# Patient Record
Sex: Female | Born: 1935 | Race: White | Hispanic: No | Marital: Married | Smoking: Current every day smoker
Health system: Southern US, Community
[De-identification: ages and names within clinical notes are randomized; demographics above are authoritative.]

## PROBLEM LIST (undated history)

## (undated) DIAGNOSIS — I10 Essential (primary) hypertension: Secondary | ICD-10-CM

## (undated) DIAGNOSIS — J8410 Pulmonary fibrosis, unspecified: Secondary | ICD-10-CM

## (undated) DIAGNOSIS — D61818 Other pancytopenia: Secondary | ICD-10-CM

## (undated) DIAGNOSIS — I4891 Unspecified atrial fibrillation: Secondary | ICD-10-CM

## (undated) DIAGNOSIS — I2721 Secondary pulmonary arterial hypertension: Secondary | ICD-10-CM

## (undated) DIAGNOSIS — F419 Anxiety disorder, unspecified: Secondary | ICD-10-CM

## (undated) DIAGNOSIS — I509 Heart failure, unspecified: Secondary | ICD-10-CM

---

## 2014-11-26 ENCOUNTER — Encounter: Payer: Self-pay | Admitting: Emergency Medicine

## 2014-11-26 ENCOUNTER — Emergency Department
Admission: EM | Admit: 2014-11-26 | Discharge: 2014-11-26 | Disposition: A | Discharge status: Other Acute Inpatient Hospital | Payer: Medicare Other | Attending: Emergency Medicine | Admitting: Emergency Medicine

## 2014-11-26 ENCOUNTER — Emergency Department: Payer: Medicare Other

## 2014-11-26 ENCOUNTER — Other Ambulatory Visit: Payer: Self-pay

## 2014-11-26 DIAGNOSIS — Y998 Other external cause status: Secondary | ICD-10-CM | POA: Insufficient documentation

## 2014-11-26 DIAGNOSIS — Z7901 Long term (current) use of anticoagulants: Secondary | ICD-10-CM | POA: Diagnosis not present

## 2014-11-26 DIAGNOSIS — Y9289 Other specified places as the place of occurrence of the external cause: Secondary | ICD-10-CM | POA: Insufficient documentation

## 2014-11-26 DIAGNOSIS — S22059A Unspecified fracture of T5-T6 vertebra, initial encounter for closed fracture: Secondary | ICD-10-CM | POA: Diagnosis not present

## 2014-11-26 DIAGNOSIS — S22049A Unspecified fracture of fourth thoracic vertebra, initial encounter for closed fracture: Secondary | ICD-10-CM | POA: Diagnosis not present

## 2014-11-26 DIAGNOSIS — S12100A Unspecified displaced fracture of second cervical vertebra, initial encounter for closed fracture: Secondary | ICD-10-CM | POA: Diagnosis not present

## 2014-11-26 DIAGNOSIS — Z79899 Other long term (current) drug therapy: Secondary | ICD-10-CM | POA: Diagnosis not present

## 2014-11-26 DIAGNOSIS — S12001A Unspecified nondisplaced fracture of first cervical vertebra, initial encounter for closed fracture: Secondary | ICD-10-CM | POA: Diagnosis not present

## 2014-11-26 DIAGNOSIS — S12000A Unspecified displaced fracture of first cervical vertebra, initial encounter for closed fracture: Secondary | ICD-10-CM

## 2014-11-26 DIAGNOSIS — S199XXA Unspecified injury of neck, initial encounter: Secondary | ICD-10-CM | POA: Diagnosis present

## 2014-11-26 DIAGNOSIS — Z720 Tobacco use: Secondary | ICD-10-CM | POA: Diagnosis not present

## 2014-11-26 DIAGNOSIS — I10 Essential (primary) hypertension: Secondary | ICD-10-CM | POA: Diagnosis not present

## 2014-11-26 DIAGNOSIS — S22039A Unspecified fracture of third thoracic vertebra, initial encounter for closed fracture: Secondary | ICD-10-CM | POA: Insufficient documentation

## 2014-11-26 DIAGNOSIS — Y9389 Activity, other specified: Secondary | ICD-10-CM | POA: Insufficient documentation

## 2014-11-26 DIAGNOSIS — W1839XA Other fall on same level, initial encounter: Secondary | ICD-10-CM | POA: Insufficient documentation

## 2014-11-26 HISTORY — DX: Pulmonary fibrosis, unspecified: J84.10

## 2014-11-26 HISTORY — DX: Unspecified atrial fibrillation: I48.91

## 2014-11-26 HISTORY — DX: Other pancytopenia: D61.818

## 2014-11-26 HISTORY — DX: Anxiety disorder, unspecified: F41.9

## 2014-11-26 HISTORY — DX: Essential (primary) hypertension: I10

## 2014-11-26 HISTORY — DX: Secondary pulmonary arterial hypertension: I27.21

## 2014-11-26 HISTORY — DX: Heart failure, unspecified: I50.9

## 2014-11-26 LAB — COMPREHENSIVE METABOLIC PANEL
ALBUMIN: 3.7 g/dL (ref 3.5–5.0)
ALT: 15 U/L (ref 14–54)
AST: 28 U/L (ref 15–41)
Alkaline Phosphatase: 105 U/L (ref 38–126)
Anion gap: 9 (ref 5–15)
BUN: 17 mg/dL (ref 6–20)
CALCIUM: 8.6 mg/dL — AB (ref 8.9–10.3)
CHLORIDE: 87 mmol/L — AB (ref 101–111)
CO2: 29 mmol/L (ref 22–32)
CREATININE: 0.92 mg/dL (ref 0.44–1.00)
GFR calc non Af Amer: 52 mL/min — ABNORMAL LOW (ref >60)
GLUCOSE: 113 mg/dL — AB (ref 65–99)
POTASSIUM: 3.9 mmol/L (ref 3.5–5.1)
SODIUM: 125 mmol/L — AB (ref 135–145)
Total Bilirubin: 1 mg/dL (ref 0.3–1.2)
Total Protein: 7.3 g/dL (ref 6.5–8.1)

## 2014-11-26 LAB — CBC
HEMATOCRIT: 33.6 % — AB (ref 35.0–47.0)
HEMOGLOBIN: 11.4 g/dL — AB (ref 12.0–16.0)
MCH: 31.9 pg (ref 26.0–34.0)
MCHC: 33.9 g/dL (ref 32.0–36.0)
MCV: 94.2 fL (ref 80.0–100.0)
PLATELETS: 244 10*3/uL (ref 150–440)
RBC: 3.57 MIL/uL — AB (ref 3.80–5.20)
RDW: 13.7 % (ref 11.5–14.5)
WBC: 8.6 10*3/uL (ref 3.6–11.0)

## 2014-11-26 LAB — PROTIME-INR
INR: 1.74
Prothrombin Time: 20.5 seconds — ABNORMAL HIGH (ref 11.4–15.0)

## 2014-11-26 LAB — APTT: aPTT: 42 seconds — ABNORMAL HIGH (ref 24–36)

## 2014-11-26 LAB — TROPONIN I

## 2014-11-26 NOTE — ED Notes (Signed)
Per family this occurred 2 days ago. Pt is in soft cervical collar on    Arrival.

## 2014-11-26 NOTE — ED Notes (Signed)
Pt here s/p fall from standing after loosing balance. C/o neck pain, bilateral knee pain with bruising exp. right knee. Pt has history of falls

## 2014-11-26 NOTE — ED Provider Notes (Signed)
[facility] Emergency Department Provider Note  ____________________________________________  Time seen: 2:30 PM  I have reviewed the triage vital signs and the nursing notes.   HISTORY  Chief Complaint Fall    HPI Morgan Barnes is a 81 y.o. female who presents after a fall that occurred 1-1/2 days ago. Per her daughter patient fell while transferring from her bedside commode to her bed. She has complained of neck pain since then. She has bruising on her head and her right knee and left knee. Patient reports she is on eliquis. She denies fevers chills. No chest pain. No pelvis or hip pain. No abdominal pain. No dizziness or lightheadedness     Past Medical History  Diagnosis Date  . Hypertension   . CHF (congestive heart failure)   . Pulmonary artery hypertension   . A-fib   . Anxiety   . Pancytopenia   . Pulmonary interstitial fibrosis     There are no active problems to display for this patient.   History reviewed. No pertinent past surgical history.  Current Outpatient Rx  Name  Route  Sig  Dispense  Refill  . amiodarone (PACERONE) 200 MG tablet   Oral   Take 200 mg by mouth daily.         Marland Kitchen apixaban (ELIQUIS) 2.5 MG TABS tablet   Oral   Take 2.5 mg by mouth 2 (two) times daily.         . furosemide (LASIX) 20 MG tablet   Oral   Take 20 mg by mouth.         Marland Kitchen lisinopril (PRINIVIL,ZESTRIL) 10 MG tablet   Oral   Take 10 mg by mouth daily.         Marland Kitchen oxycodone (OXY-IR) 5 MG capsule   Oral   Take 5 mg by mouth every 4 (four) hours as needed.           Allergies Review of patient's allergies indicates no known allergies.  History reviewed. No pertinent family history.  Social History History  Substance Use Topics  . Smoking status: Current Every Day Smoker  . Smokeless tobacco: Not on file  . Alcohol Use: No    Review of Systems  Constitutional: Negative for fever. Eyes: Negative for visual changes. ENT:  Negative for sore throat Cardiovascular: Negative for chest pain. Respiratory: Negative for shortness of breath. Gastrointestinal: Negative for abdominal pain, vomiting and diarrhea. Genitourinary: Negative for dysuria. Musculoskeletal: Positive for neck pain Skin: Positive for bruising Neurological: Negative for headaches or focal weakness Psychiatric: No anxiety    ____________________________________________   PHYSICAL EXAM:  VITAL SIGNS: ED Triage Vitals  Enc Vitals Group     BP 11/26/14 1316 158/92 mmHg     Pulse Rate 11/26/14 1316 107     Resp --      Temp 11/26/14 1316 98.8 F (37.1 C)     Temp Source 11/26/14 1316 Oral     SpO2 11/26/14 1316 90 %     Weight 11/26/14 1316 93 lb (42.185 kg)     Height 11/26/14 1316 4\' 10"  (1.473 m)     Head Cir --      Peak Flow --      Pain Score 11/26/14 1318 7     Pain Loc --      Pain Edu? --      Excl. in GC? --      Constitutional: Alert and oriented. Well appearing and in no distress. Pleasant  and interactive Eyes: Conjunctivae are normal. PERRLA ENT   Head: Normocephalic and atraumatic.   Mouth/Throat: Mucous membranes are moist. Cardiovascular:  Normal and symmetric distal pulses are present in all extremities.  Respiratory: Normal respiratory effort without tachypnea nor retractions. Breath sounds are clear and equal bilaterally.  Gastrointestinal: Soft and non-tender in all quadrants. No distention. There is no CVA tenderness. Genitourinary: deferred Musculoskeletal: Nontender with normal range of motion in all extremities. No hip tenderness no pelvis tenderness. No pain with axial pressure on hips. Full range of motion of bilateral lower extremities despite extensive bruising on the right knee. Compartments are soft in both extremities Significant kyphosis noted.  Neurologic:  Normal speech and language. Normal upper and lower extremity strength. Sensation intact in the upper and lower extremities. Cranial  nerves II through XII are intact Skin:  Skin is warm, dry and intact. No rash noted. Psychiatric: Mood and affect are normal. Patient exhibits appropriate insight and judgment.  ____________________________________________    LABS (pertinent positives/negatives)  Labs Reviewed  CBC  COMPREHENSIVE METABOLIC PANEL  APTT  PROTIME-INR  TROPONIN I    ____________________________________________   EKG  ED ECG REPORT I, Jene Every, the attending physician, personally viewed and interpreted this ECG.  Date: 11/26/2014 EKG Time: 4:44 PM Rate: 105 Rhythm: Sinus tachycardia QRS Axis: normal Intervals: normal ST/T Wave abnormalities: normal Conduction Disutrbances: none Narrative Interpretation: unremarkable   ____________________________________________    RADIOLOGY I have personally reviewed any xrays that were ordered on this patient: Multiple C1 fractures, and a C2 fracture on cervical spine CT. In addition T3-T4 and T5 fractures noted CT head unremarkable X-ray of the right knee shows no fracture ____________________________________________   PROCEDURES  Procedure(s) performed: none  Critical Care performed: yes  CRITICAL CARE Performed by: Jene Every   Total critical care time: 30 minutes  Critical care time was exclusive of separately billable procedures and treating other patients.  Critical care was necessary to treat or prevent imminent or life-threatening deterioration.  Critical care was time spent personally by me on the following activities: development of treatment plan with patient and/or surrogate as well as nursing, discussions with consultants, evaluation of patient's response to treatment, examination of patient, obtaining history from patient or surrogate, ordering and performing treatments and interventions, ordering and review of laboratory studies, ordering and review of radiographic studies, pulse oximetry and re-evaluation of  patient's condition.   ____________________________________________   INITIAL IMPRESSION / ASSESSMENT AND PLAN / ED COURSE  Pertinent labs & imaging results that were available during my care of the patient were reviewed by me and considered in my medical decision making (see chart for details).  Patient with neck pain and head injury on eliquis, c-collar in place. We will obtain CT cervical spine and head, x-ray of the right knee and reevaluate  ----------------------------------------- 4:04 PM on 11/26/2014 -----------------------------------------  Notified by radiologist of cervical fractures and thoracic fractures, patient is in c-collar. We will send labs establish IV  ____________________________________________  ----------------------------------------- 4:52 PM on 11/26/2014 -----------------------------------------  Accepted by Dr. Christene Lye of [facility] for ED to ED transport. Family aware.  FINAL CLINICAL IMPRESSION(S) / ED DIAGNOSES  Final diagnoses:  Closed C1 fracture, initial encounter  C2 cervical fracture, closed, initial encounter  T3 vertebral fracture, closed, initial encounter  T4 vertebral fracture, closed, initial encounter  T5 vertebral fracture, closed, initial encounter     Jene Every, MD 11/26/14 1656

## 2014-12-27 DEATH — deceased

## 2016-02-18 IMAGING — CT CT CERVICAL SPINE W/O CM
2 of 3 series · 8 of 14 positions shown, 9 images · non-contrast
Comparison: None.

CLINICAL DATA: [AGE] female post fall. Neck pain. Initial
encounter.

EXAM:
CT HEAD WITHOUT CONTRAST
CT CERVICAL SPINE WITHOUT CONTRAST
TECHNIQUE: Multidetector CT imaging of the head and cervical spine was
performed following the standard protocol without intravenous
contrast. Multiplanar CT image reconstructions of the cervical spine
were also generated.

[Series 5: c spine soft · axial · 0.44mm/px · z∈[+210,+280]mm · 4 of 59 slices shown]
[im 12/59  soft-tissue]
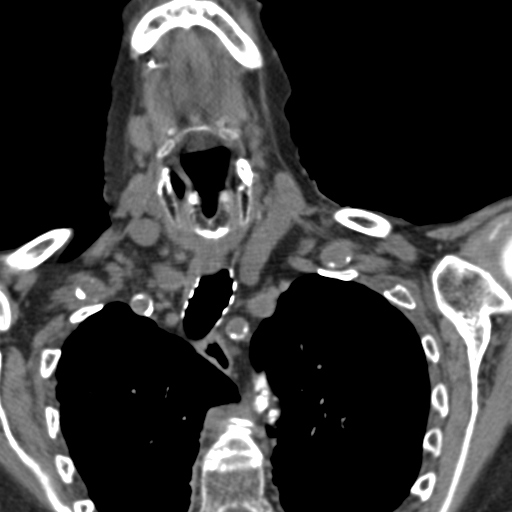
[im 24/59  soft-tissue]
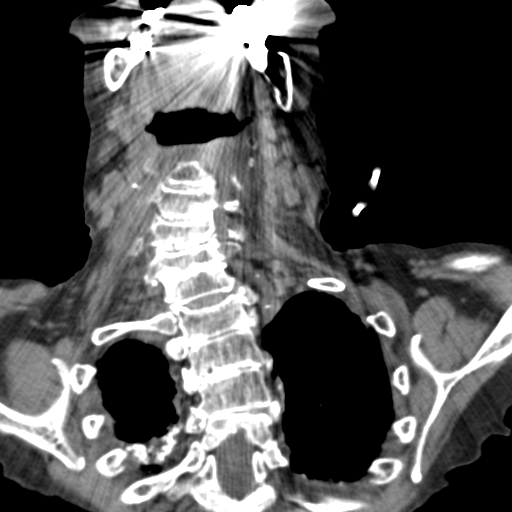
[im 35/59  soft-tissue]
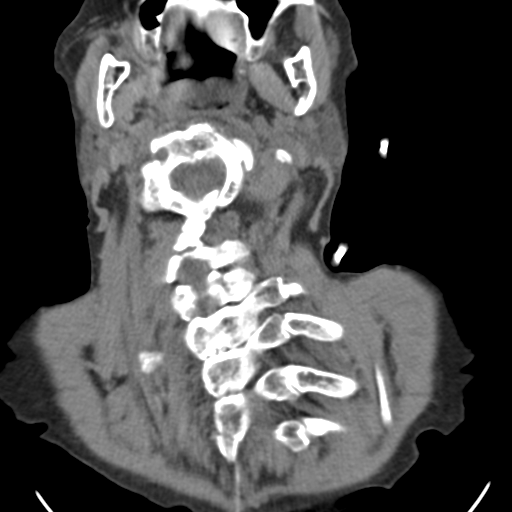
[im 47/59  soft-tissue]
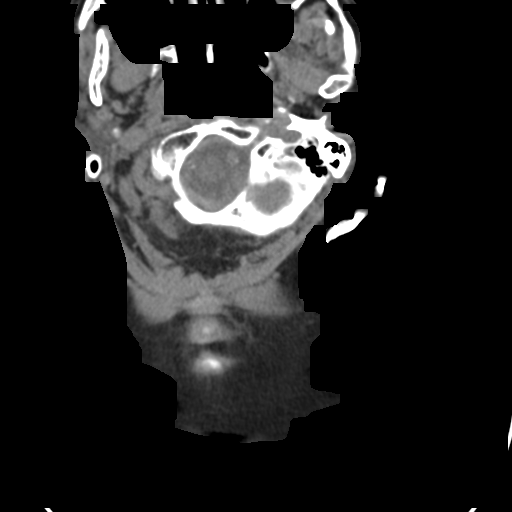

[Series 8: orthogonal axials · axial · 0.29mm/px · z∈[+213,+283]mm · 4 of 59 slices shown, 5 images]
[im 12/59  soft-tissue]
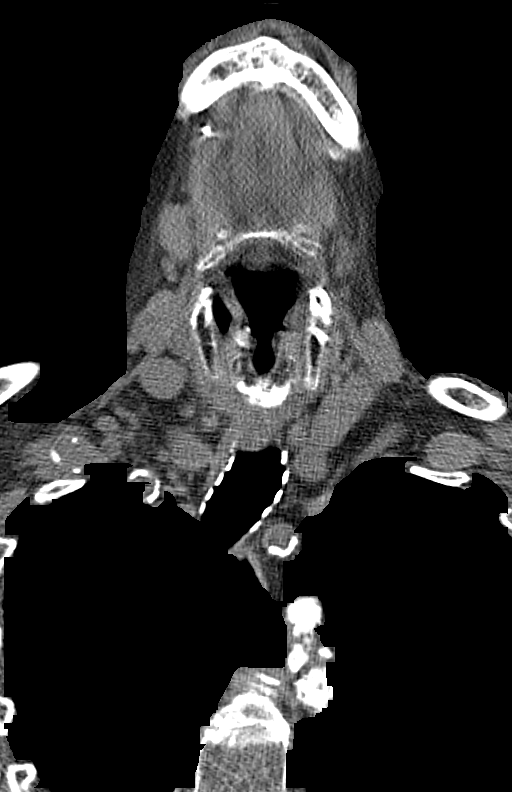
[im 12/59  bone]
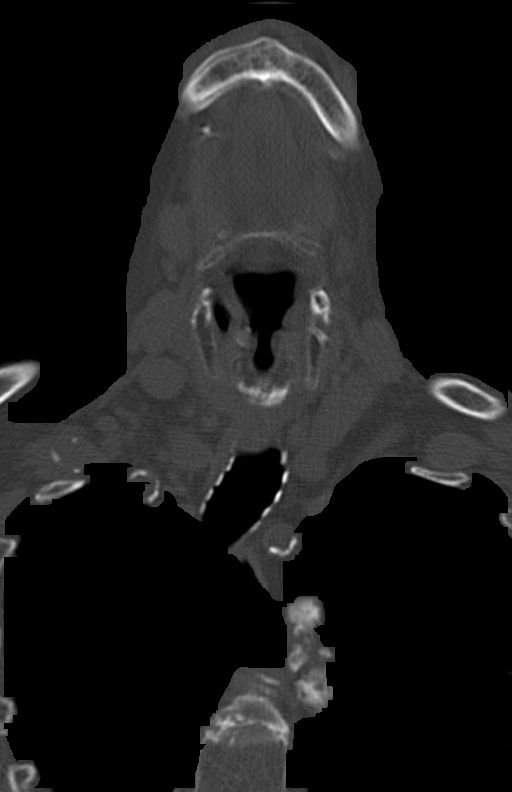
[im 24/59  bone]
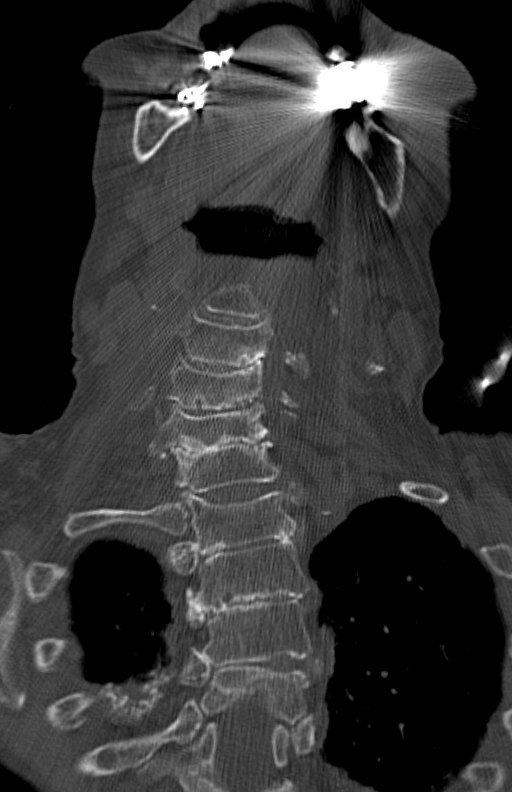
[im 35/59  bone]
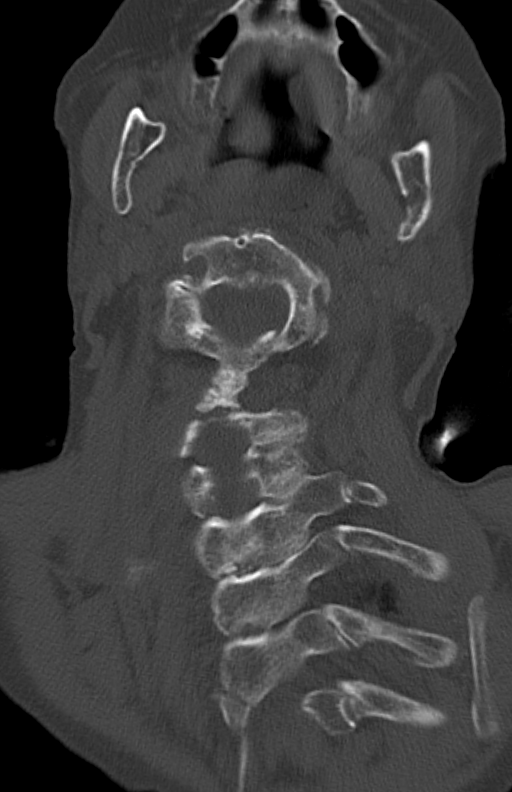
[im 47/59  bone]
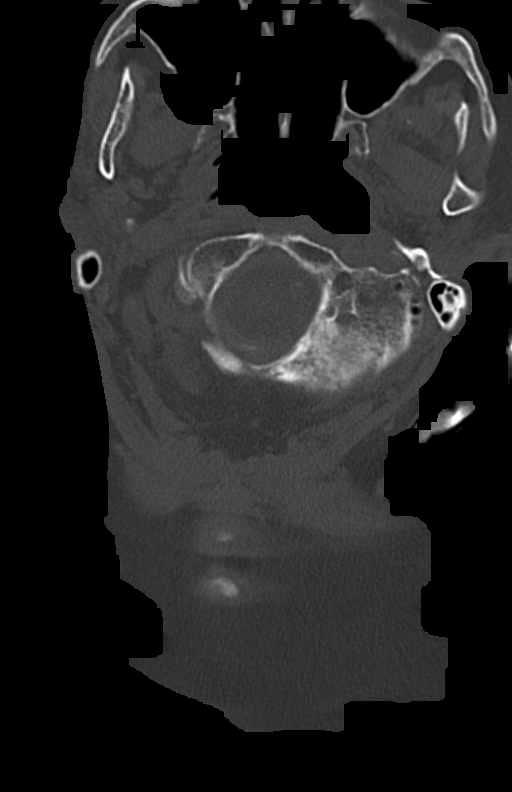

[8 of 14 positions shown; findings below may reference images not displayed]

FINDINGS: CT HEAD FINDINGS

Mild motion degradation.

No skull fracture or intracranial hemorrhage.

Anterior right parafalcine calcified 8.2 mm meningioma without
surrounding vasogenic edema.

Small vessel disease type changes without CT evidence of large acute
infarct.

Global atrophy without hydrocephalus.

CT CERVICAL SPINE FINDINGS

C1 ring is fraction 3 places. Anterior just left of midline, right
lateral posterior aspect and left posterior lateral aspect (oblique
axial plane).

Mid dens fracture with 4 mm posterior displacement with mild
angulation of the fracture dens. This causes posterior displacement
of transverse ligament hypertrophy and mild spinal stenosis with
slight cord contact.

Fracture of the base of the T3 spinous process extending into the
left T3 lamina. Fracture of the left T3 transverse process.

Anterior wedge compression fracture of the T4 vertebral body with
80% loss height anteriorly. Mild retropulsion posterior superior
aspect with narrowing of the ventral aspect the canal and cord
draped over this region. Fracture of the right T4 lamina extending
into the facet/transverse process. This is felt less likely to
represent an artifact

Compression of the superior endplate of T5 with 50% loss height and
mild retropulsion posterior superior aspect with narrowing of the
ventral aspect of the canal and minimal cord contact.

T1 superior endplate Schmorl's node deformity/mild compression
fracture. This may be remote.

Prominent left-sided C4-5 and C5-6 facet joint degenerative changes
with elongation of the facet greatest at the C5 level. 5 mm anterior
slip C5. Minimal anterior slip C4. Prominent disc degeneration C5-6
and C6-7.

If a cord injury is of high clinical concern and patient is able MR
imaging may be considered.

Prominent atherosclerotic type changes of all visualize arterial
structures.

Calcified pleura may reflect result of prior asbestos exposure.
IMPRESSION: CT HEAD

Mild motion degradation.

No skull fracture or intracranial hemorrhage.

Anterior right parafalcine calcified 8.2 mm meningioma without
surrounding vasogenic edema.

Small vessel disease type changes without CT evidence of large acute
infarct.

Global atrophy without hydrocephalus.

CT CERVICAL SPINE

C1 ring is fraction 3 places. Anterior just left of midline, right
lateral posterior aspect and left posterior lateral aspect (oblique
axial plane).

Mid dens fracture with 4 mm posterior displacement with mild
angulation of the fracture dens. This causes posterior displacement
of transverse ligament hypertrophy and mild spinal stenosis with
slight cord contact.

Fracture of the base of the T3 spinous process extending into the
left T3 lamina. Fracture of the left T3 transverse process.

Anterior wedge compression fracture of the T4 vertebral body with
80% loss height anteriorly. Mild retropulsion posterior superior
aspect with narrowing of the ventral aspect the canal and cord
draped over this region. Fracture of the right T4 lamina extending
into the facet/transverse process. This is felt less likely to
represent an artifact

Compression of the superior endplate of T5 with 50% loss height and
mild retropulsion posterior superior aspect with narrowing of the
ventral aspect of the canal and minimal cord contact.

T1 superior endplate Schmorl's node deformity/mild compression
fracture. This may be remote.

Prominent left-sided C4-5 and C5-6 facet joint degenerative changes
with elongation of the facet greatest at the C5 level. 5 mm anterior
slip C5. Minimal anterior slip C4. Prominent disc degeneration C5-6
and C6-7.

If a cord injury is of high clinical concern and patient is able MR
imaging may be considered.

These results were called by telephone at the time of interpretation
on 11/26/2014 at [DATE] to Dr. SUBRINA LANG , who verbally
acknowledged these results.
